# Patient Record
Sex: Male | Born: 1955 | Race: Black or African American | Hispanic: No | Marital: Single
Health system: Southern US, Community
[De-identification: ages and names within clinical notes are randomized; demographics above are authoritative.]

---

## 2004-12-31 ENCOUNTER — Other Ambulatory Visit: Payer: Self-pay

## 2004-12-31 ENCOUNTER — Inpatient Hospital Stay: Payer: Self-pay | Admitting: Internal Medicine

## 2010-12-24 ENCOUNTER — Inpatient Hospital Stay: Payer: Self-pay | Admitting: Internal Medicine

## 2012-09-19 ENCOUNTER — Emergency Department: Payer: Self-pay | Admitting: Emergency Medicine

## 2012-09-19 LAB — COMPREHENSIVE METABOLIC PANEL
Alkaline Phosphatase: 55 U/L (ref 50–136)
BUN: 35 mg/dL — ABNORMAL HIGH (ref 7–18)
Bilirubin,Total: 0.4 mg/dL (ref 0.2–1.0)
Calcium, Total: 9.1 mg/dL (ref 8.5–10.1)
Chloride: 103 mmol/L (ref 98–107)
Co2: 27 mmol/L (ref 21–32)
EGFR (African American): >60
Osmolality: 290 (ref 275–301)
Potassium: 4.2 mmol/L (ref 3.5–5.1)
SGOT(AST): 12 U/L — ABNORMAL LOW (ref 15–37)
Sodium: 136 mmol/L (ref 136–145)

## 2012-09-19 LAB — CBC
HCT: 44.1 % (ref 40.0–52.0)
MCH: 27.1 pg (ref 26.0–34.0)
MCHC: 32.7 g/dL (ref 32.0–36.0)
MCV: 83 fL (ref 80–100)
RBC: 5.32 10*6/uL (ref 4.40–5.90)
RDW: 14 % (ref 11.5–14.5)

## 2012-09-19 LAB — URINALYSIS, COMPLETE
Bacteria: NONE SEEN
Blood: NEGATIVE
Glucose,UR: >=500 mg/dL (ref 0–75)
Hyaline Cast: 4
Ketone: NEGATIVE
Nitrite: NEGATIVE
Ph: 5 (ref 4.5–8.0)
Protein: NEGATIVE
RBC,UR: 1 /HPF (ref 0–5)
Squamous Epithelial: <1

## 2012-09-19 LAB — LIPASE, BLOOD: Lipase: 82 U/L (ref 73–393)

## 2013-03-16 IMAGING — US US CAROTID DUPLEX BILAT
1 series · 17 of 24 positions shown · non-contrast
Comparison: None

REASON FOR EXAM: sncope
COMMENTS:

PROCEDURE:     US  - US CAROTID DOPPLER BILATERAL  - December 24, 2010  [DATE]
RESULT:     Indication: Syncope
TECHNIQUE: Gray-scale, color Doppler, and spectral Doppler images were
obtained of the extracranial carotid artery systems and vertebral arteries
in the neck.

[Series 1: us carotid duplex bilat · 17 of 59 slices shown]
[im 1/59]
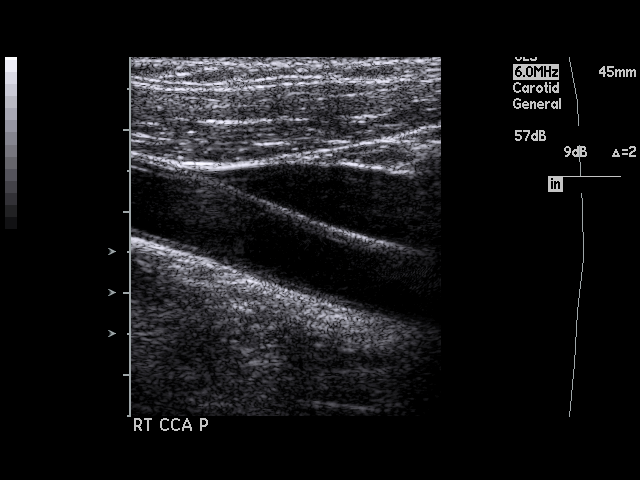
[im 6/59]
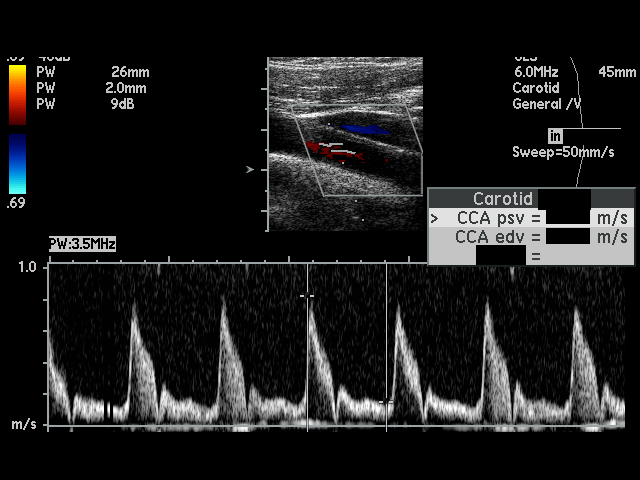
[im 8/59]
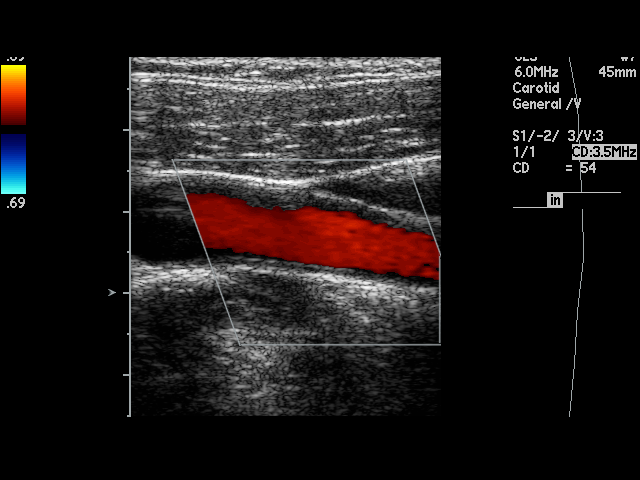
[im 11/59]
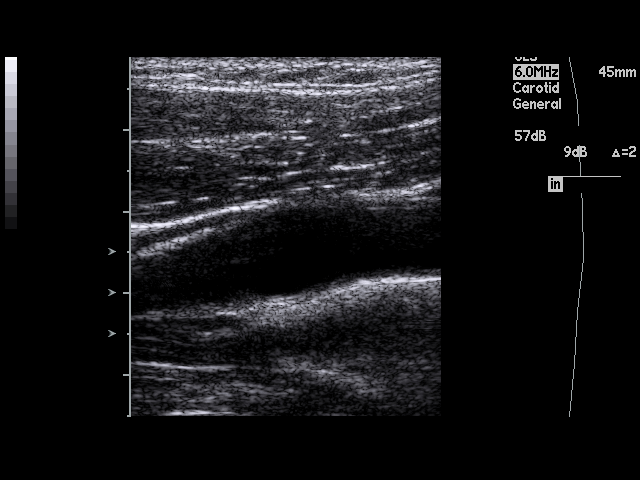
[im 16/59]
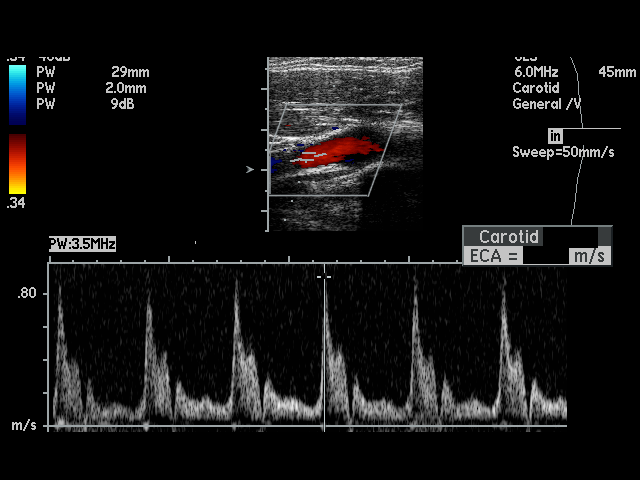
[im 18/59]
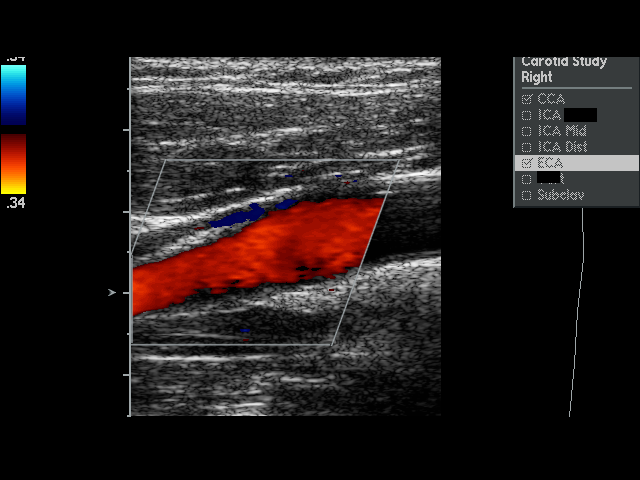
[im 23/59]
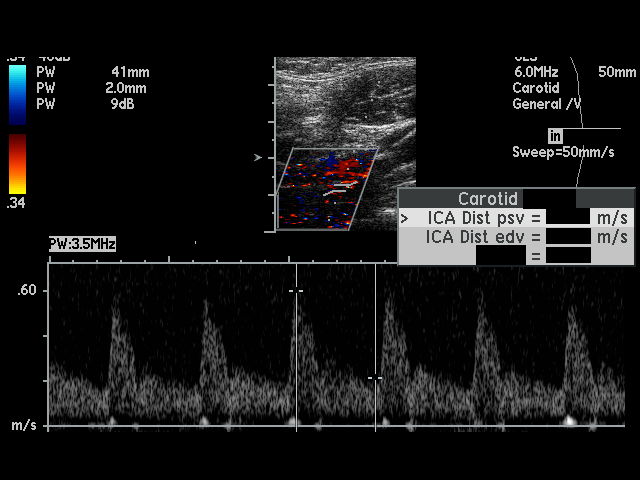
[im 26/59]
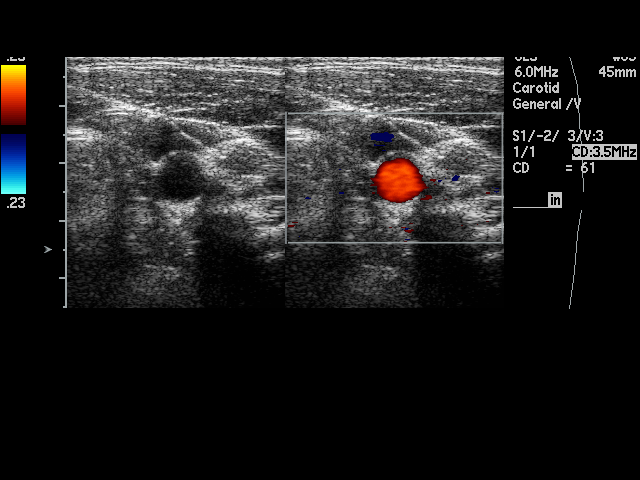
[im 31/59]
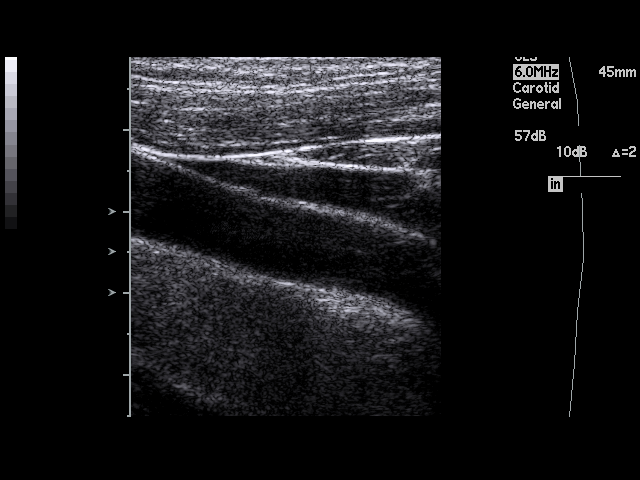
[im 33/59]
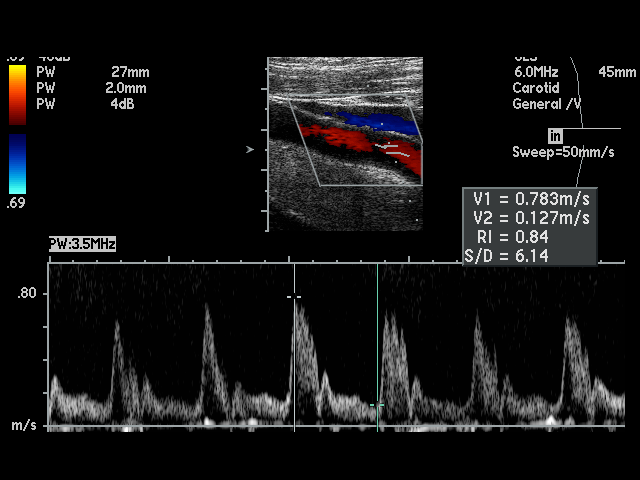
[im 36/59]
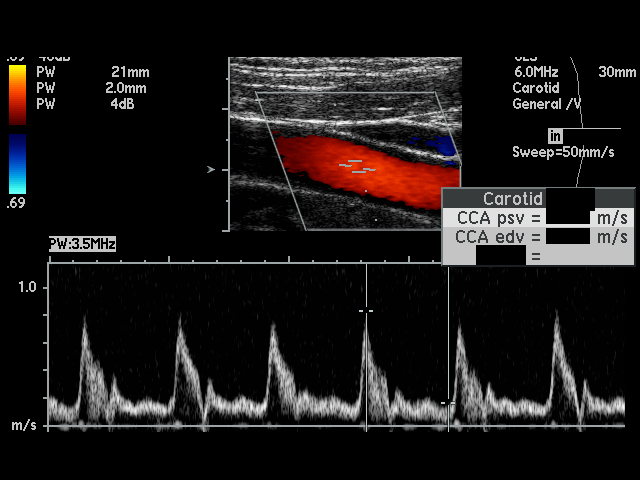
[im 41/59]
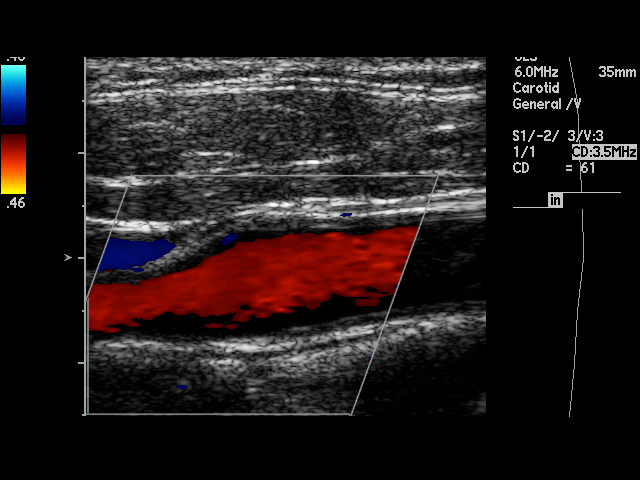
[im 43/59]
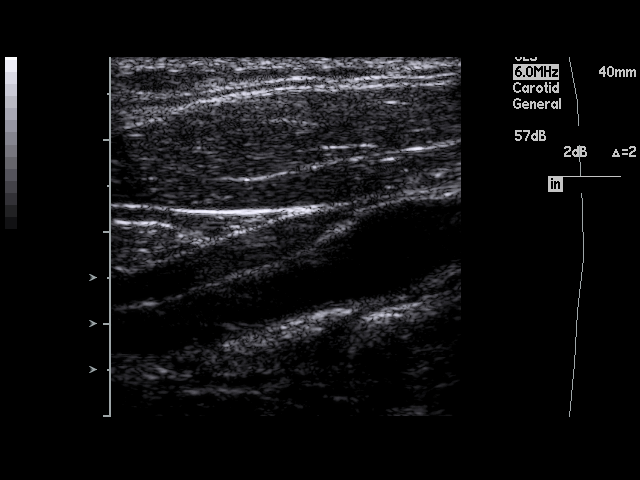
[im 48/59]
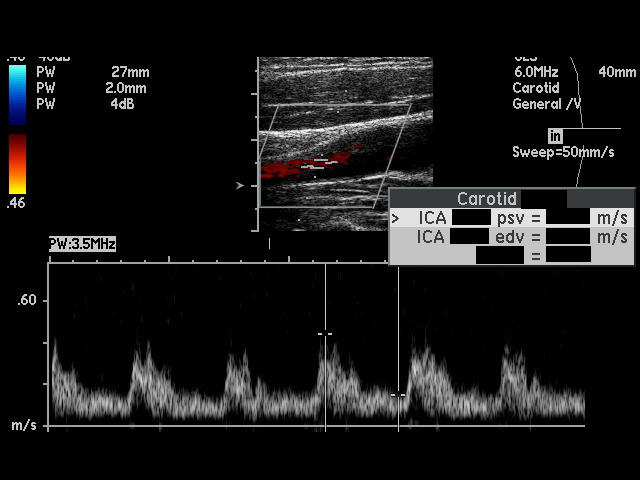
[im 51/59]
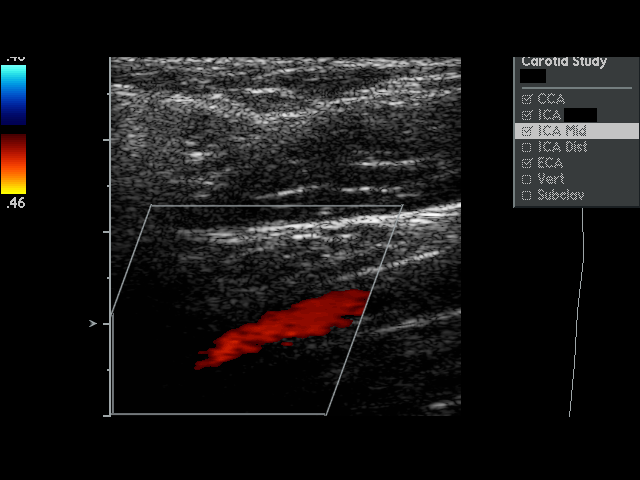
[im 53/59]
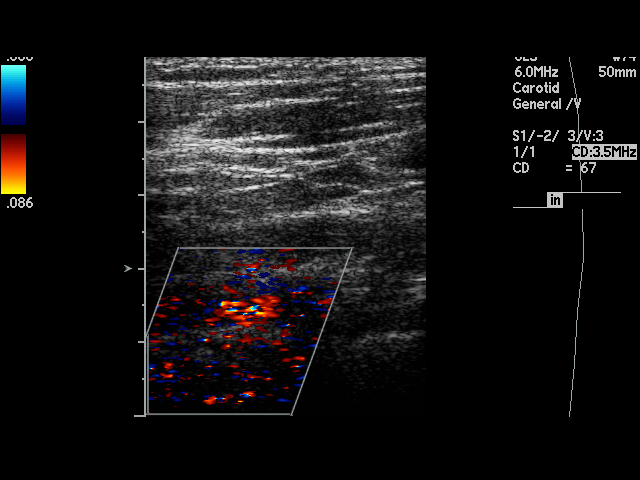
[im 59/59]
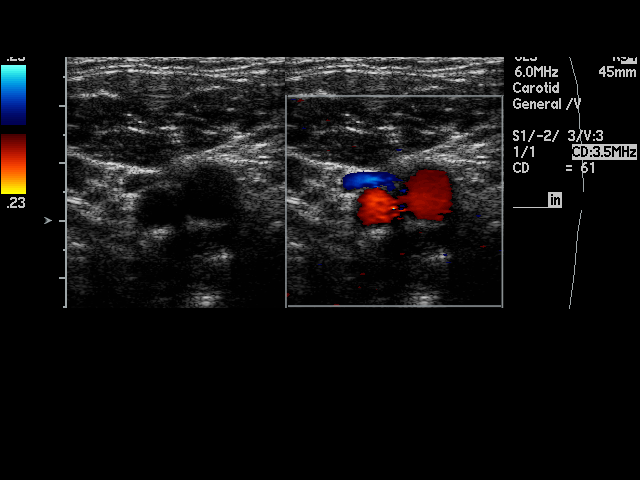

[17 of 24 positions shown; findings below may reference images not displayed]

FINDINGS: On the right, there is no significant atherosclerotic plaque. Maximum peak
systolic velocity in the right CCA is 83 cm/second. Maximum peak systolic
velocity in the right ICA is 60 cm/second. Maximum peak systolic velocity in
the right ECA is 91 cm/second. The right ICA/CCA ratio is 0.73. This
corresponds to a stenosis of less than 50 %. Antegrade blood flow is
documented in the right vertebral artery.

On the left, there is no atherosclerotic plaque. Maximum peak systolic
velocity in the left CCA is 83 cm/second. Maximum peak systolic velocity in
the left ICA is 98 cm/second. Maximum peak systolic velocity in the left ECA
is 99 cm/second. The left ICA/CCA ratio is 1.17. This corresponds to a
stenosis of less than 50 %. Antegrade blood flow is documented in the left
vertebral artery.
IMPRESSION: 1. No hemodynamically significant carotid artery stenosis.

## 2013-03-16 IMAGING — CT CT HEAD WITHOUT CONTRAST
2 series · 16 of 30 positions shown, 20 images · non-contrast
Comparison: none

REASON FOR EXAM: syncope
COMMENTS:

[Series 2: without · axial · non-contrast · 0.43mm/px · z∈[-152,-22]mm · 13 of 32 slices shown, 17 images]
[im 3/32  brain]
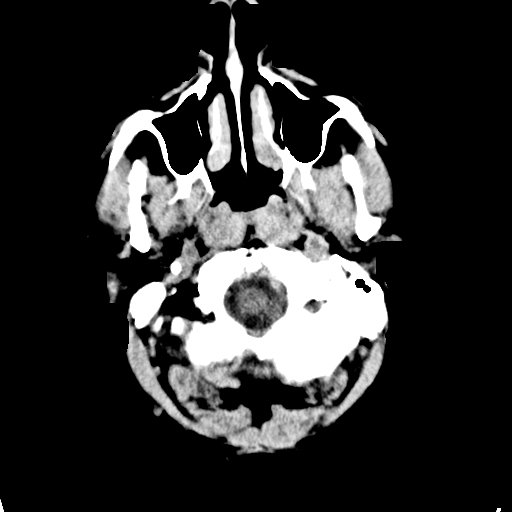
[im 3/32  bone]
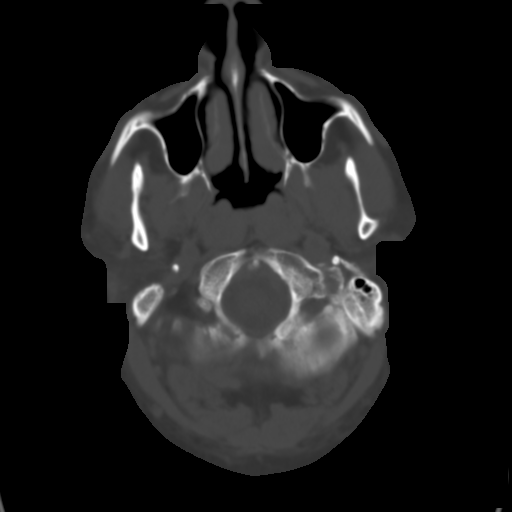
[im 5/32  brain]
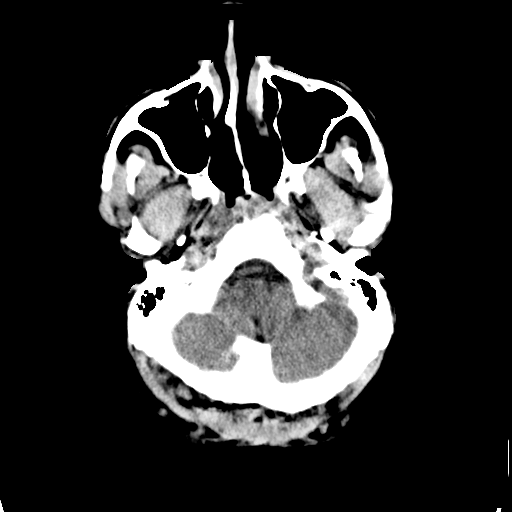
[im 7/32  brain]
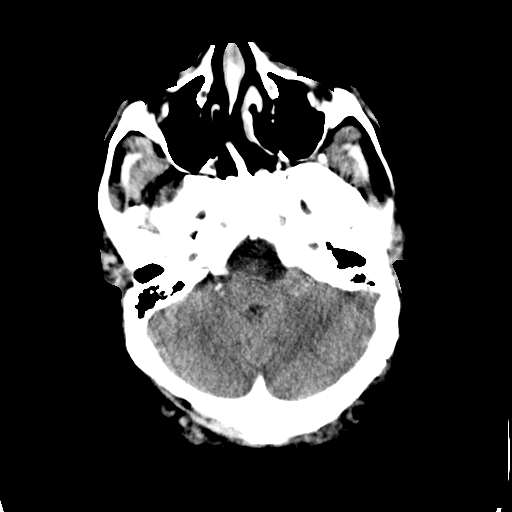
[im 9/32  brain]
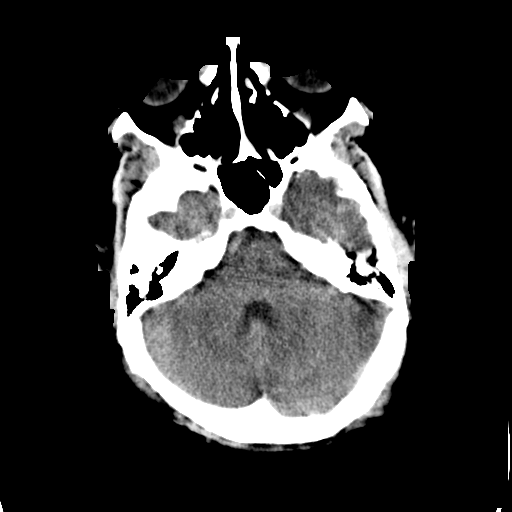
[im 12/32  brain]
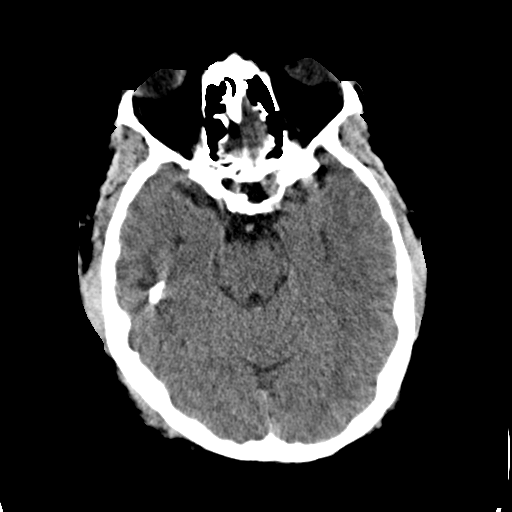
[im 12/32  bone]
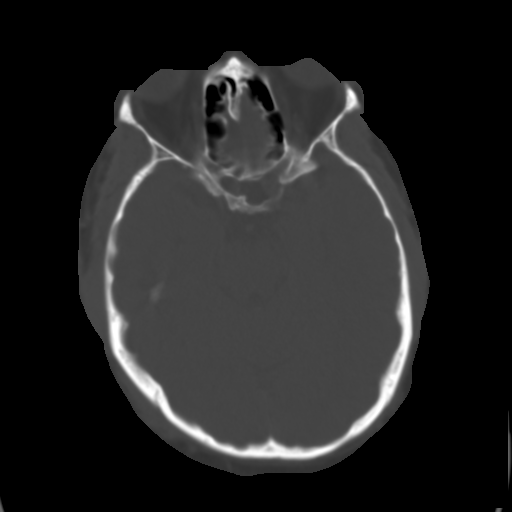
[im 14/32  brain]
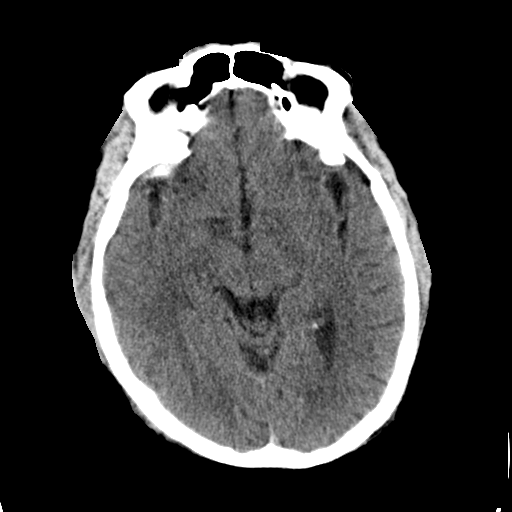
[im 16/32  brain]
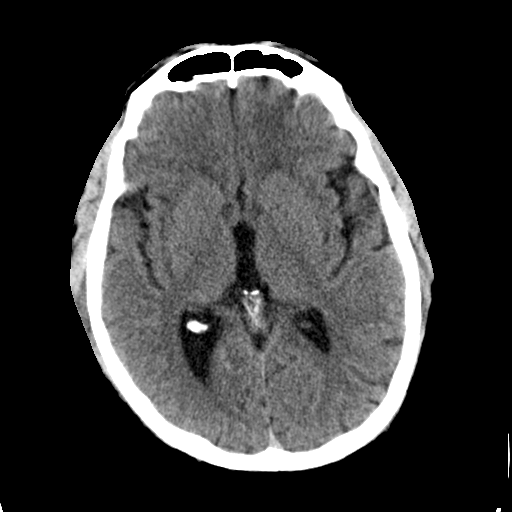
[im 18/32  brain]
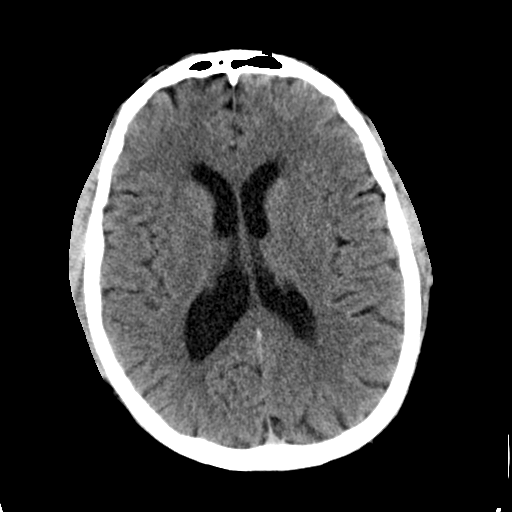
[im 20/32  brain]
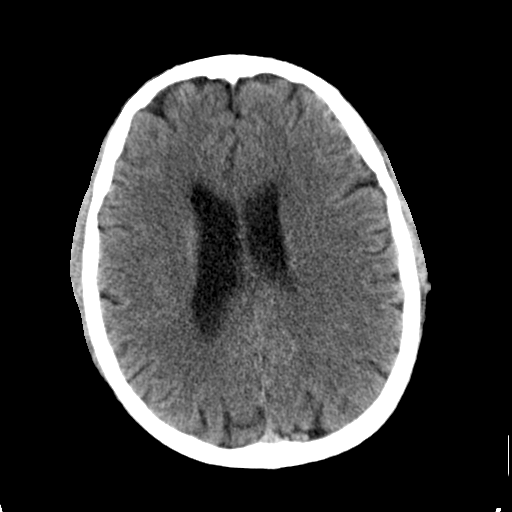
[im 20/32  bone]
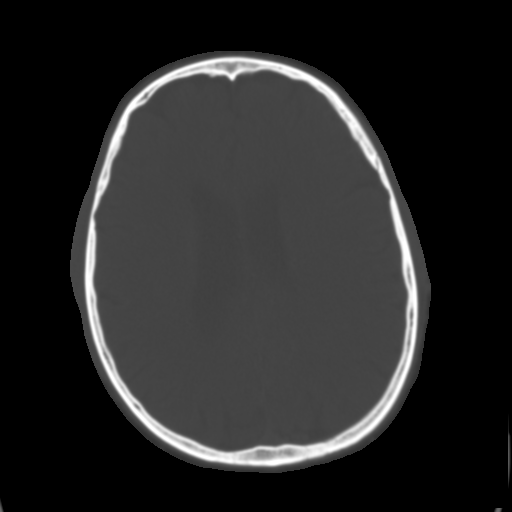
[im 23/32  brain]
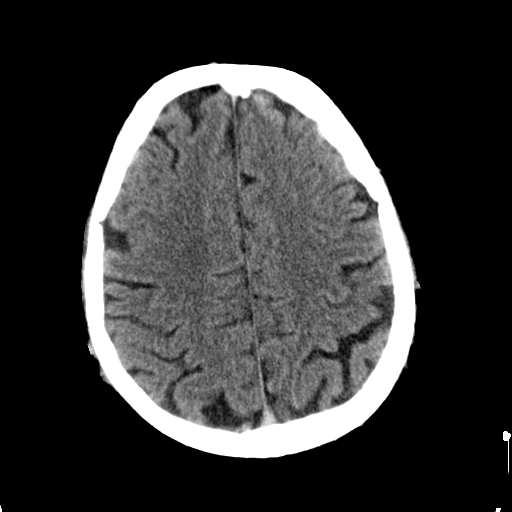
[im 25/32  brain]
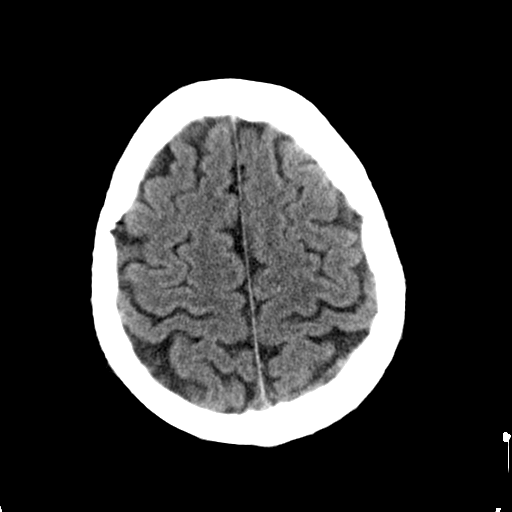
[im 27/32  brain]
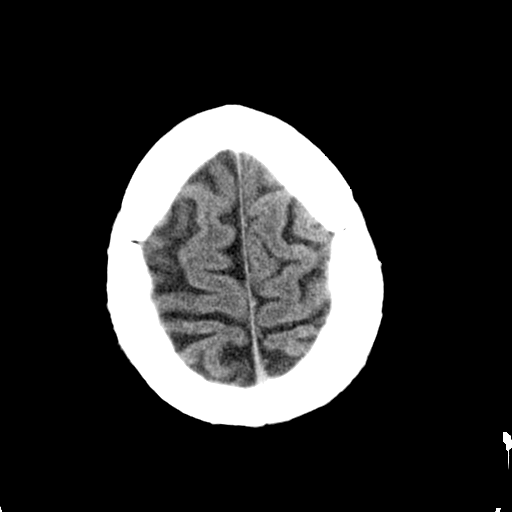
[im 29/32  brain]
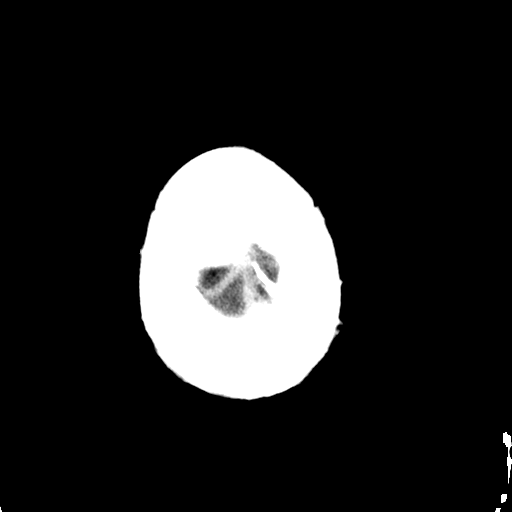
[im 29/32  bone]
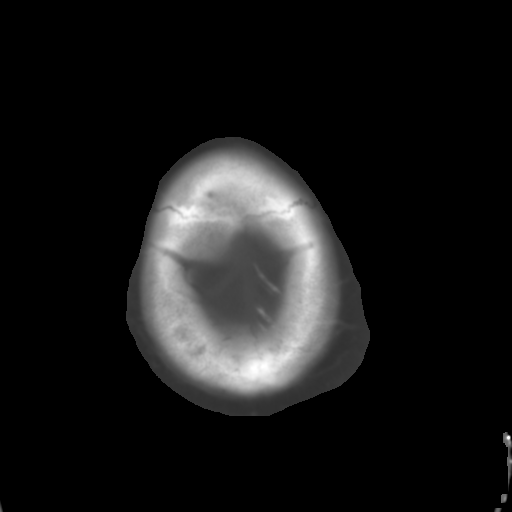

[Series 3: bone · axial · 0.43mm/px · z∈[-152,-107]mm · 3 of 32 slices shown]
[im 3/32  bone]
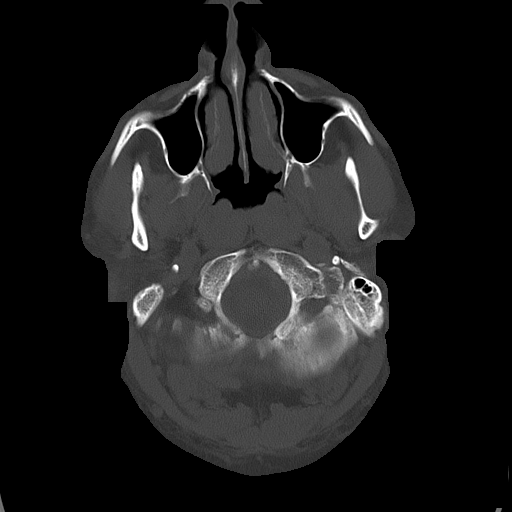
[im 7/32  bone]
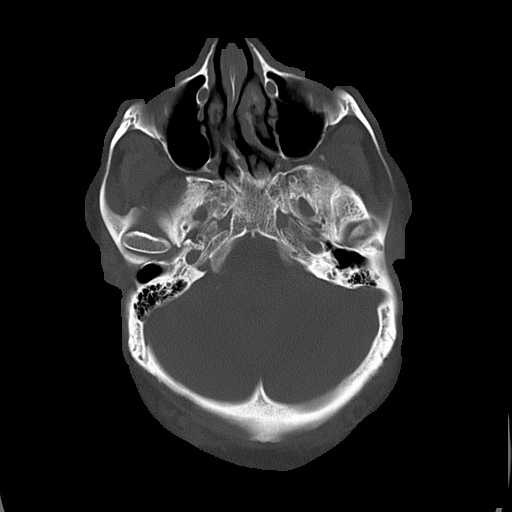
[im 12/32  bone]
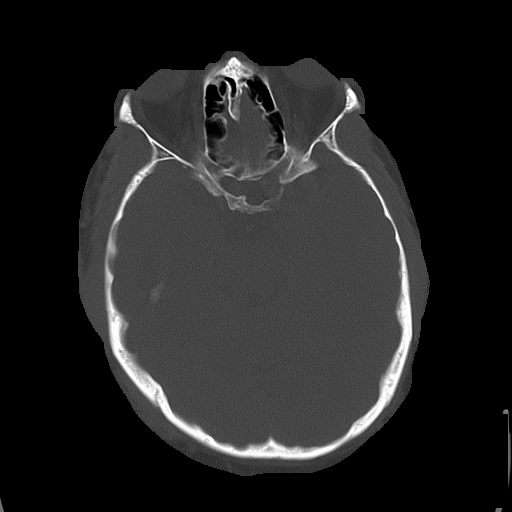

[16 of 30 positions shown; findings below may reference images not displayed]

PROCEDURE:     CT  - CT HEAD WITHOUT CONTRAST  - December 24, 2010  [DATE]

RESULT:     Axial noncontrast CT scanning was performed through the brain at
5 mm intervals and slice thicknesses.

The ventricles are normal in size and position. There is no intracranial
hemorrhage nor intracranial mass effect. There is no evidence of an evolving
ischemic infarction. The cerebellum and brainstem are normal in density. At
bone window settings there may be a retention cyst or polyp in the left
maxillary sinus. I see no air-fluid levels in the paranasal sinuses. There
is no evidence of an acute skull fracture.
IMPRESSION: 1. I see no evidence of an acute ischemic or hemorrhagic event within the
brain.
2. There is no evidence of hydrocephalus nor intracranial mass effect.
3. There may be a retention cyst or polyp in the left maxillary sinus.

## 2014-04-10 ENCOUNTER — Emergency Department: Payer: Self-pay | Admitting: Emergency Medicine

## 2014-04-10 LAB — URINALYSIS, COMPLETE
BILIRUBIN, UR: NEGATIVE
Bacteria: NONE SEEN
Glucose,UR: >=500 mg/dL (ref 0–75)
KETONE: NEGATIVE
Leukocyte Esterase: NEGATIVE
NITRITE: NEGATIVE
Ph: 5 (ref 4.5–8.0)
RBC,UR: 10 /HPF (ref 0–5)
SQUAMOUS EPITHELIAL: NONE SEEN
Specific Gravity: 1.017 (ref 1.003–1.030)

## 2014-04-10 LAB — COMPREHENSIVE METABOLIC PANEL
ANION GAP: 6 — AB (ref 7–16)
Albumin: 3.6 g/dL (ref 3.4–5.0)
Alkaline Phosphatase: 64 U/L
BUN: 27 mg/dL — AB (ref 7–18)
Bilirubin,Total: 0.6 mg/dL (ref 0.2–1.0)
CALCIUM: 9.1 mg/dL (ref 8.5–10.1)
CREATININE: 1.21 mg/dL (ref 0.60–1.30)
Chloride: 105 mmol/L (ref 98–107)
Co2: 27 mmol/L (ref 21–32)
Glucose: 263 mg/dL — ABNORMAL HIGH (ref 65–99)
Osmolality: 290 (ref 275–301)
Potassium: 3.8 mmol/L (ref 3.5–5.1)
SGOT(AST): 11 U/L — ABNORMAL LOW (ref 15–37)
SGPT (ALT): 19 U/L
Sodium: 138 mmol/L (ref 136–145)
Total Protein: 8.7 g/dL — ABNORMAL HIGH (ref 6.4–8.2)

## 2014-04-10 LAB — CBC WITH DIFFERENTIAL/PLATELET
BASOS PCT: 0.7 %
Basophil #: 0.1 10*3/uL (ref 0.0–0.1)
EOS ABS: 0.2 10*3/uL (ref 0.0–0.7)
EOS PCT: 2.1 %
HCT: 40.2 % (ref 40.0–52.0)
HGB: 12.9 g/dL — AB (ref 13.0–18.0)
LYMPHS ABS: 0.6 10*3/uL — AB (ref 1.0–3.6)
Lymphocyte %: 8.4 %
MCH: 26.2 pg (ref 26.0–34.0)
MCHC: 32.1 g/dL (ref 32.0–36.0)
MCV: 82 fL (ref 80–100)
MONO ABS: 0.3 x10 3/mm (ref 0.2–1.0)
MONOS PCT: 4.5 %
NEUTROS PCT: 84.3 %
Neutrophil #: 6.5 10*3/uL (ref 1.4–6.5)
Platelet: 208 10*3/uL (ref 150–440)
RBC: 4.93 10*6/uL (ref 4.40–5.90)
RDW: 14.6 % — ABNORMAL HIGH (ref 11.5–14.5)
WBC: 7.7 10*3/uL (ref 3.8–10.6)

## 2014-04-10 LAB — TROPONIN I

## 2014-07-09 ENCOUNTER — Inpatient Hospital Stay: Payer: Self-pay | Admitting: Internal Medicine

## 2014-07-09 LAB — URINALYSIS, COMPLETE
BILIRUBIN, UR: NEGATIVE
Bacteria: NONE SEEN
Blood: NEGATIVE
Glucose,UR: >=500 mg/dL (ref 0–75)
Ketone: NEGATIVE
LEUKOCYTE ESTERASE: NEGATIVE
Nitrite: NEGATIVE
PH: 5 (ref 4.5–8.0)
PROTEIN: NEGATIVE
SQUAMOUS EPITHELIAL: NONE SEEN
Specific Gravity: 1.006 (ref 1.003–1.030)
WBC UR: NONE SEEN /HPF (ref 0–5)

## 2014-07-09 LAB — COMPREHENSIVE METABOLIC PANEL
ALT: 15 U/L — AB
Albumin: 4.1 g/dL
Alkaline Phosphatase: 65 U/L
Anion Gap: 10 (ref 7–16)
BUN: 72 mg/dL — AB
Bilirubin,Total: 0.7 mg/dL
CALCIUM: 8.8 mg/dL — AB
CHLORIDE: 94 mmol/L — AB
CREATININE: 1.93 mg/dL — AB
Co2: 20 mmol/L — ABNORMAL LOW
GFR CALC AF AMER: 43 — AB
GFR CALC NON AF AMER: 37 — AB
GLUCOSE: 359 mg/dL — AB
POTASSIUM: 4.8 mmol/L
SGOT(AST): 22 U/L
Sodium: 124 mmol/L — ABNORMAL LOW
Total Protein: 8.9 g/dL — ABNORMAL HIGH

## 2014-07-09 LAB — CBC
HCT: 38.8 % — AB (ref 40.0–52.0)
HGB: 12.8 g/dL — ABNORMAL LOW (ref 13.0–18.0)
MCH: 26 pg (ref 26.0–34.0)
MCHC: 33 g/dL (ref 32.0–36.0)
MCV: 79 fL — ABNORMAL LOW (ref 80–100)
Platelet: 172 10*3/uL (ref 150–440)
RBC: 4.93 10*6/uL (ref 4.40–5.90)
RDW: 14.7 % — ABNORMAL HIGH (ref 11.5–14.5)
WBC: 7.1 10*3/uL (ref 3.8–10.6)

## 2014-07-10 LAB — CBC WITH DIFFERENTIAL/PLATELET
BASOS ABS: 0 10*3/uL (ref 0.0–0.1)
Basophil %: 0.6 %
EOS ABS: 0.2 10*3/uL (ref 0.0–0.7)
Eosinophil %: 2.8 %
HCT: 36.2 % — AB (ref 40.0–52.0)
HGB: 11.6 g/dL — ABNORMAL LOW (ref 13.0–18.0)
LYMPHS ABS: 0.9 10*3/uL — AB (ref 1.0–3.6)
Lymphocyte %: 16.9 %
MCH: 25.7 pg — ABNORMAL LOW (ref 26.0–34.0)
MCHC: 32 g/dL (ref 32.0–36.0)
MCV: 80 fL (ref 80–100)
Monocyte #: 0.4 x10 3/mm (ref 0.2–1.0)
Monocyte %: 7.1 %
NEUTROS ABS: 4 10*3/uL (ref 1.4–6.5)
Neutrophil %: 72.6 %
Platelet: 163 10*3/uL (ref 150–440)
RBC: 4.52 10*6/uL (ref 4.40–5.90)
RDW: 14.5 % (ref 11.5–14.5)
WBC: 5.5 10*3/uL (ref 3.8–10.6)

## 2014-07-10 LAB — BASIC METABOLIC PANEL
Anion Gap: 9 (ref 7–16)
BUN: 73 mg/dL — ABNORMAL HIGH
CALCIUM: 9.1 mg/dL
CO2: 22 mmol/L
Chloride: 101 mmol/L
Creatinine: 1.91 mg/dL — ABNORMAL HIGH
Glucose: 341 mg/dL — ABNORMAL HIGH
Potassium: 4 mmol/L
SODIUM: 132 mmol/L — AB

## 2014-07-11 LAB — CREATININE, SERUM
CREATININE: 1.37 mg/dL — AB
EGFR (Non-African Amer.): 56 — ABNORMAL LOW

## 2014-07-15 DEATH — deceased

## 2014-08-14 NOTE — Discharge Summary (Signed)
PATIENT NAME:  Dominic Jones, Dominic Jones DATE OF BIRTH:  [birth year]  DATE OF ADMISSION:  07/09/2014 DATE OF DISCHARGE:  07/11/2014  PRESENTING COMPLAINT: Elevated sugars.   DISCHARGE DIAGNOSES:  1.  Type 2 diabetes, uncontrolled.  2.  Chronic renal failure, creatinine back to baseline 1.3.  3.  Hypertension.   CONDITION ON DISCHARGE: Fair.   CODE STATUS: Full code.   MEDICATIONS:  1.  Lantus 45 units b.i.d.  2.  NovoLog insulin 10 units t.i.d. with meals.  3.  Aspirin 81 mg daily.  4.  Carvedilol 25 mg 2 tablets b.i.d.  5.  Lasix 40 mg b.i.d.  6.  Imdur 60 mg extended release p.o. daily.  7.  Hydrochlorothiazide/lisinopril 12.5/20 two tablets p.o. daily.  8.  Amlodipine 5 mg daily.  9.  Atorvastatin 40 mg daily.   DIET: Low sodium, carbohydrate-controlled diet.   FOLLOWUP: [facility] Kar[facility]. Keep a log of his sugars at home.   LABORATORY DATA: Creatinine at discharge was 1.37. Glucose was 149. H and H were 11.6 and 36.2.   BRIEF SUMMARY OF HOSPITAL COURSE: [name] is a pleasant 59 year old African American gentleman with a long-standing history of diabetes, hypertension, and history of congestive heart failure with EF of 15% comes in with:  1.  Acute on chronic renal failure, suspect prerenal. The patient received some IV gentle hydration, creatinine at baseline, resumed his p.o. lisinopril/hydrochlorothiazide.  2.  Type 2 diabetes. Sugars were greater than 300 at home. His insulin was adjusted with Lantus 45 units b.i.d. giving him 90 units total per day along with NovoLog 10 units t.i.d. He is advised to get endocrine consult for stricter sugar control of his diabetes if his sugars remain still out of control.  3.  Hyponatremia likely due to elevated sugars.  4.  Systolic congestive heart failure, stable.  5.  Deep vein thrombosis prophylaxis, subcutaneous heparin t.i.d.   Hospital stay otherwise remained stable. The patient remained  a full code.   TIME SPENT: 40 minutes.    ____________________________ Wylie HailSona A. Allena KatzPatel, MD sap:AT D: 07/11/2014 15:55:22 ET T: 07/12/2014 05:01:16 ET JOB#: [id number]  cc: Crespin Forstrom A. Allena KatzPatel, MD, <Dictator> Willow OraSONA A Brandi Armato MD ELECTRONICALLY SIGNED 07/18/2014 12:25

## 2014-08-14 NOTE — H&P (Signed)
PATIENT NAME:  Dominic Jones, HEHL MR#:  [id number] DATE OF BIRTH:  07-29-1955  DATE OF ADMISSION:  07/09/2014  REFERRING PHYSICIAN: Charlestine Night. Scotty Court, MD   PRIMARY CARE PHYSICIAN: Karrie Doffing, MD  CARDIOLOGIST: Lamar Blinks, MD, [facility].   CHIEF COMPLAINT: High glucose.   HISTORY OF PRESENT ILLNESS: A 59 year old African American gentleman with a past medical history of systolic congestive heart failure, ejection fraction of 15%; type 2 diabetes, insulin-requiring; presenting with elevated blood glucose. He states he has had nausea without vomiting for the last 2 days' duration with poor p.o. intake. Despite this, noticed elevated blood glucose. He stated he did continue to take his insulin as scheduled but still has elevated glucose. Denies any further complaints. Presented to the hospital for the above.  REVIEW OF SYSTEMS:  CONSTITUTIONAL: Denies fevers, chills, fatigue, weakness.  EYES: Denies blurry vision, double vision, eye pain.  EARS, NOSE, THROAT: Denies tinnitus, ear pain, hearing loss. RESPIRATORY: Denies cough, wheeze, shortness of breath.  CARDIOVASCULAR: Denies chest pain, palpitations. Positive for edema, chronic. GASTROINTESTINAL: Positive for nausea. Denies vomiting, diarrhea, constipation, abdominal pain.  GENITOURINARY: Denies dysuria or hematuria.  ENDOCRINE: Denies nocturia or thyroid problems. HEMATOLOGIC AND LYMPHATIC: Denies easy bruising or bleeding.  SKIN: Denies rashes or lesions.  MUSCULOSKELETAL: Denies pain in neck, back, shoulders, knees, hips or arthritic symptoms.  NEUROLOGIC: Denies paralysis, paresthesias.  PSYCHIATRIC: Denies anxiety or depressive symptoms.  Otherwise, full review of systems performed be me is negative.  PAST MEDICAL HISTORY: Includes systolic congestive heart failure, EF of 15%; type 2 diabetes, insulin-requiring; hypertension, essential; obstructive sleep apnea.   SOCIAL HISTORY: No alcohol, tobacco, or drug  usage.  FAMILY HISTORY: Positive for hypertension.   ALLERGIES: No known drug allergies.   HOME MEDICATIONS: Include aspirin 81 mg p.o. daily, Imdur 60 mg p.o. daily, Lantus 70 units subcutaneously daily, NovoLog 7 units 3 times daily, atorvastatin 40 mg p.o. daily, hydrochlorothiazide/lisinopril 12.5/20 mg 2 tabs p.o. daily, Coreg 25 mg 2 tabs p.o. b.i.d., Norvasc 5 mg p.o. daily, Lasix 40 mg p.o. b.i.d.   PHYSICAL EXAMINATION:  VITAL SIGNS: Temperature 98, heart rate 99, respirations 18, blood pressure 170/81, saturating 99% on room air. Weight 113.4 kg, BMI of 32.1.  GENERAL: Well-nourished, well-developed, African American gentleman currently in no acute distress.  HEAD: Normocephalic, atraumatic.  EYES: Pupils equal, round, reactive to light. Extraocular muscles intact. No scleral icterus.  MOUTH: Moist mucosal membrane. Dentition intact. No abscess noted. EARS, NOSE, THROAT: Clear without exudates. No external lesions.  NECK: Supple. No thyromegaly. No nodules. No JVD.  PULMONARY: Clear to auscultation bilaterally without wheezes, rales, or rhonchi. No use of accessory muscles. Good respiratory effort.  CHEST: Nontender to palpation.  CARDIOVASCULAR: S1, S2, regular rate and rhythm. No murmurs, rubs, or gallops. 2+ edema of the lower extremities to the shins bilaterally. Pedal pulses 2+ bilaterally. GASTROINTESTINAL: Soft, nontender, nondistended. No masses. Positive bowel sounds. No hepatosplenomegaly. MUSCULOSKELETAL: No swelling, clubbing, or edema. Range of motion full in all extremities.   NEUROLOGIC: Cranial nerves II through XII intact. No gross focal neurological deficits. Sensation intact. Reflexes intact.  SKIN: No ulcerations, lesions, rashes, or cyanosis. Skin warm, dry. Turgor intact. PSYCHIATRIC: Mood, affect within normal limits. Patient awake, alert, oriented x 3. Insight and judgment intact.   LABORATORY DATA: Sodium 124, potassium 4.8, chloride 94, bicarbonate of 20,  anion gap of 10, BUN 72, creatinine 1.93, glucose of 359. LFTs: Protein of 8.9, albumin 4.1, otherwise within normal limits. WBC of 7.1,  hemoglobin 12.8, platelets of 172,000. Urinalysis negative for evidence of infection.   ASSESSMENT AND PLAN: A 59 year old African American gentleman with a history of systolic congestive heart failure, ejection fraction of 15%; type 2 diabetes, insulin-requiring; presenting with elevated blood glucose.  1.  Acute kidney injury, suspect prerenal in etiology given medication as well as history. Gentle intravenous fluid hydration. We will hold nephrotoxic agents including hydrochlorothiazide, Lasix, lisinopril. Follow urine output and renal function.  2.  Type 2 diabetes, insulin-requiring. Hold p.o. agents. Add insulin sliding scale and q.6 hour Accu-Cheks. Will also continue with home dosages of Lantus.  3.  Hyponatremia. Adjusted for hyperglycemia, this turns out to be 130. Will provide gentle intravenous fluid hydration as stated above. Follow sodium levels. 4.   Systolic congestive heart failure. Hold diuretics as stated above for acute kidney injury. Will likely need to restart, potentially at lower dose, tomorrow. If required, we will obtain consult to Dr. Gwen PoundsKowalski for further medication adjustment.  5.  Venous thromboembolism prophylaxis with heparin subcutaneous.  CODE STATUS: The patient is full code.   TIME SPENT: 45 minutes.    ____________________________ [name], MD dkh:ST D: 07/09/2014 23:11:34 ET T: 07/10/2014 00:12:14 ET JOB#: [id number]  cc: [name], MD, <Dictator> DAVID Synetta ShadowK HOWER MD ELECTRONICALLY SIGNED 07/10/2014 20:53

## 2016-07-01 IMAGING — CR DG ABDOMEN 3V
1 series · 4 of 4 positions shown · non-contrast
Comparison: Chest x-ray 12/31/2004

CLINICAL DATA: Abdominal discomfort and constipation 4 days. Rectal
pain.

EXAM:
ABDOMEN SERIES

[Series 1: dxr abdomen 3-way (incl pa cxr) · 0.14mm/px · 4 of 4 slices shown]
[im 1/4]
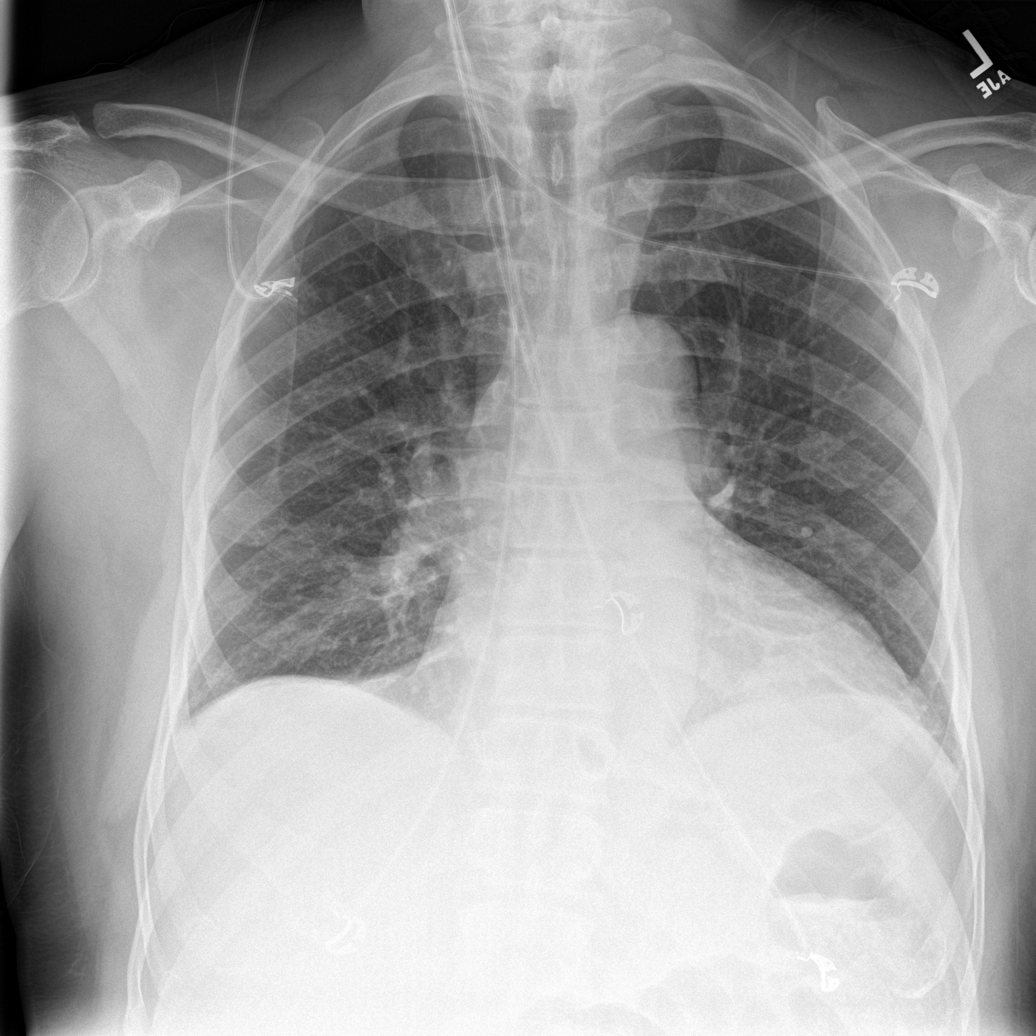
[im 2/4]
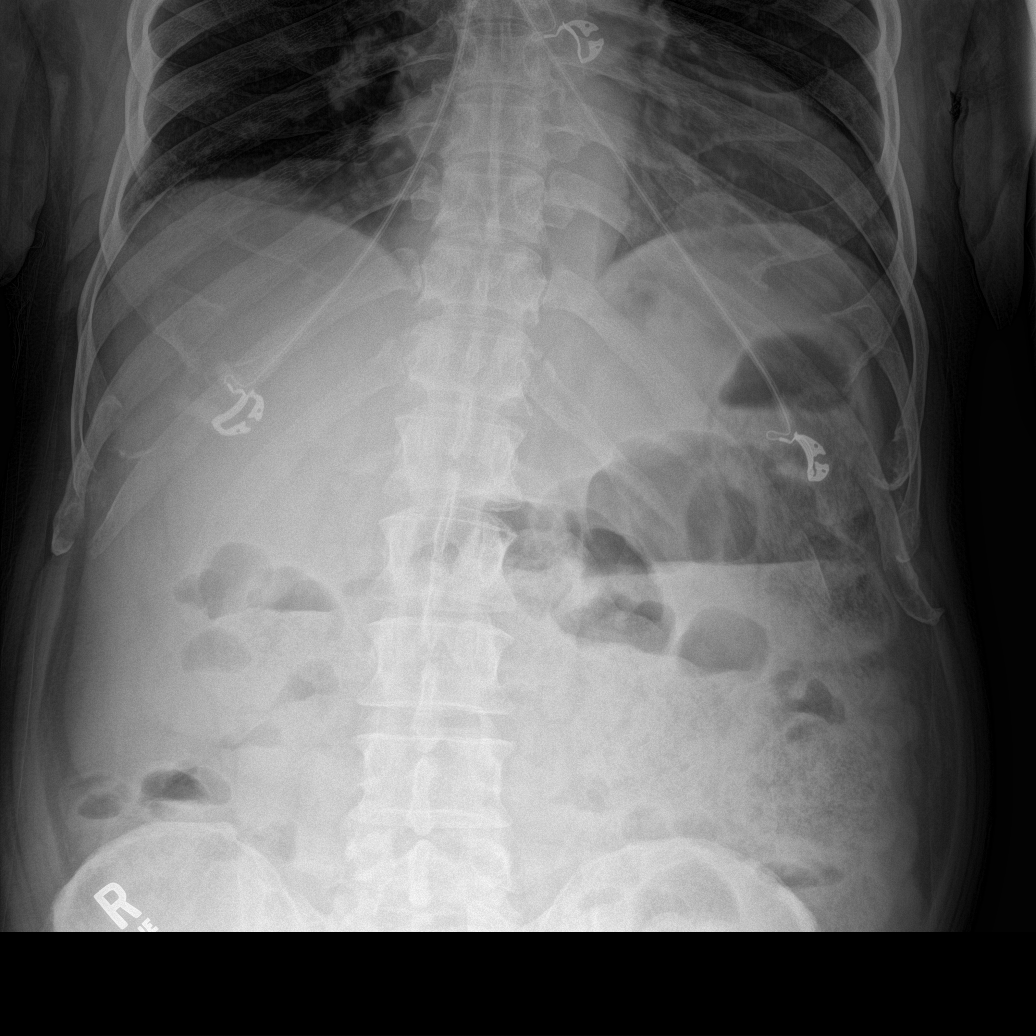
[im 3/4]
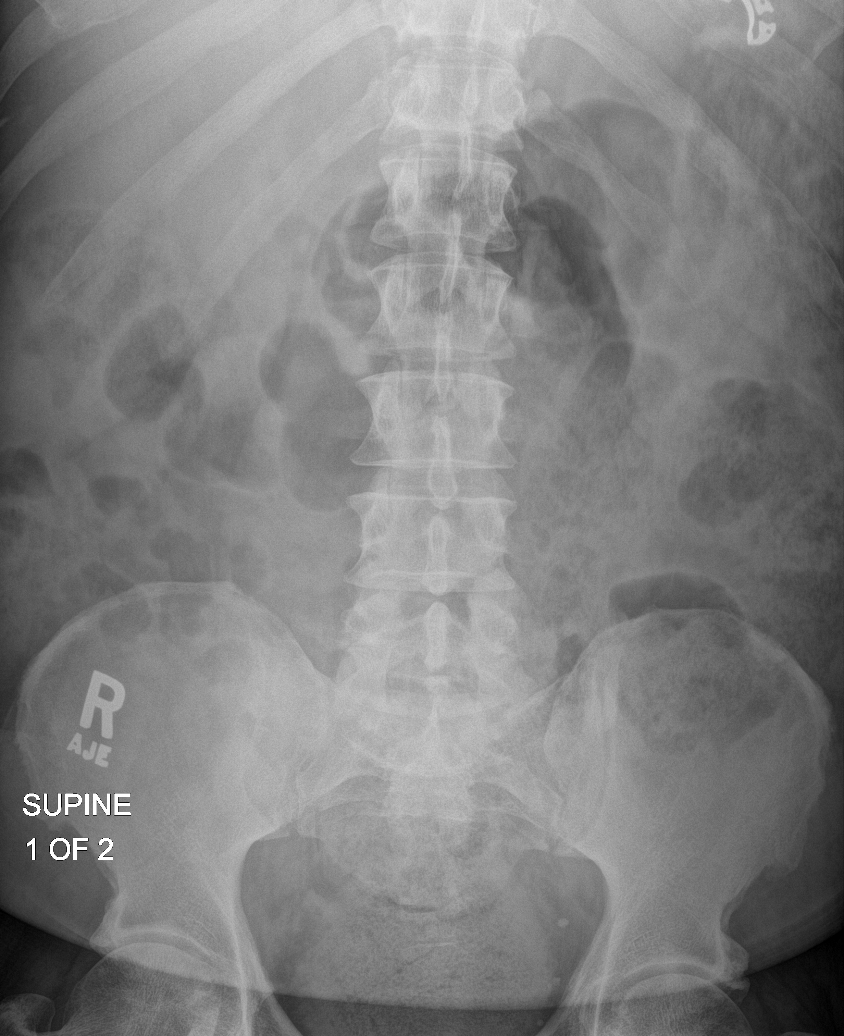
[im 4/4]
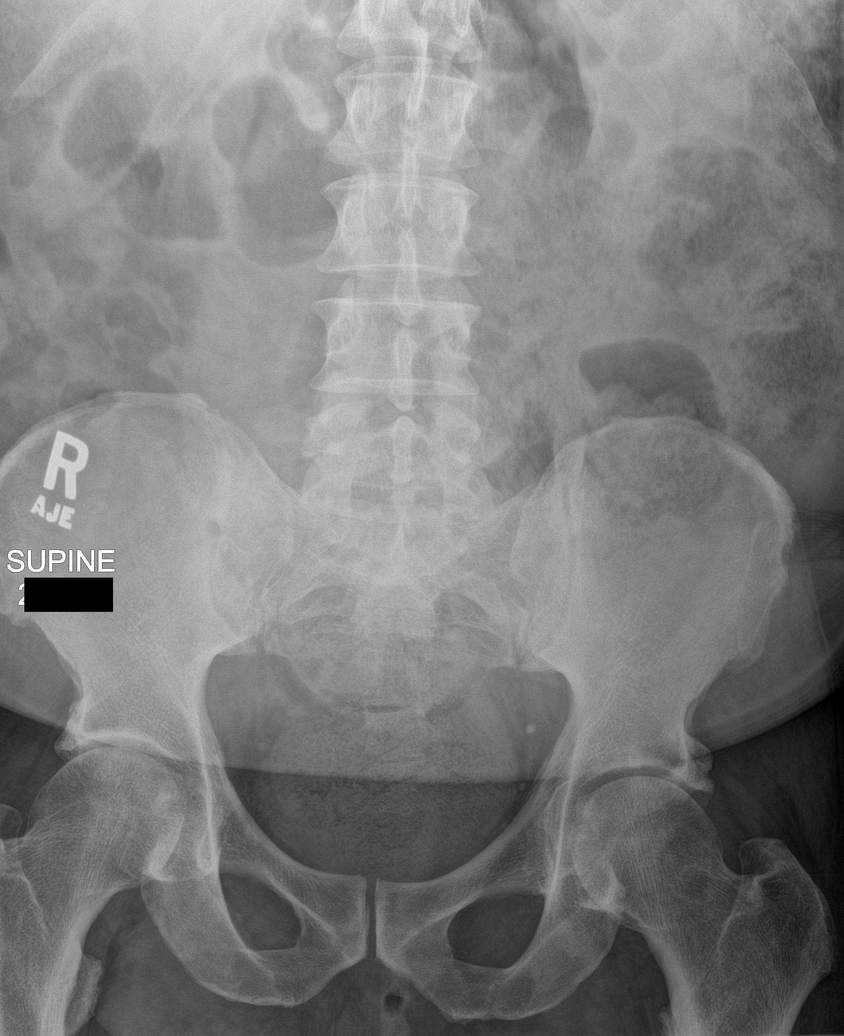

[4 of 4 positions shown; findings below may reference images not displayed]

FINDINGS: Lungs are adequately inflated without focal consolidation or
effusion. There is mild stable cardiomegaly. Remainder of the chest
is unchanged.

Abdominal images demonstrate a few nonspecific air-fluid levels
predominately over the colon. There is moderate fecal retention most
prominent over the rectosigmoid and descending colon. No free
peritoneal air. There are mild degenerative changes of the spine and
hips.
IMPRESSION: Moderate fecal retention over the rectosigmoid colon and descending
colon without evidence of obstruction. Few nonspecific scattered
colonic air-fluid levels.

No acute cardiopulmonary disease.
